# Patient Record
Sex: Male | Born: 1995 | Race: White | Hispanic: No
Health system: Southern US, Community
[De-identification: ages and names within clinical notes are randomized; demographics above are authoritative.]

---

## 2019-11-23 ENCOUNTER — Emergency Department (HOSPITAL_COMMUNITY): Payer: No Typology Code available for payment source

## 2019-11-23 ENCOUNTER — Emergency Department (HOSPITAL_COMMUNITY)
Admission: EM | Admit: 2019-11-23 | Discharge: 2019-11-23 | Discharge status: Against Medical Advice | Payer: No Typology Code available for payment source | Attending: Emergency Medicine | Admitting: Emergency Medicine

## 2019-11-23 DIAGNOSIS — F1510 Other stimulant abuse, uncomplicated: Secondary | ICD-10-CM | POA: Diagnosis not present

## 2019-11-23 DIAGNOSIS — R400 Somnolence: Secondary | ICD-10-CM | POA: Diagnosis not present

## 2019-11-23 DIAGNOSIS — Y92410 Unspecified street and highway as the place of occurrence of the external cause: Secondary | ICD-10-CM | POA: Diagnosis not present

## 2019-11-23 DIAGNOSIS — Y998 Other external cause status: Secondary | ICD-10-CM | POA: Diagnosis not present

## 2019-11-23 DIAGNOSIS — Z5320 Procedure and treatment not carried out because of patient's decision for unspecified reasons: Secondary | ICD-10-CM | POA: Insufficient documentation

## 2019-11-23 DIAGNOSIS — F1210 Cannabis abuse, uncomplicated: Secondary | ICD-10-CM | POA: Insufficient documentation

## 2019-11-23 DIAGNOSIS — R9089 Other abnormal findings on diagnostic imaging of central nervous system: Secondary | ICD-10-CM | POA: Diagnosis not present

## 2019-11-23 DIAGNOSIS — Y9389 Activity, other specified: Secondary | ICD-10-CM | POA: Diagnosis not present

## 2019-11-23 DIAGNOSIS — Z041 Encounter for examination and observation following transport accident: Secondary | ICD-10-CM | POA: Diagnosis present

## 2019-11-23 LAB — CBC WITH DIFFERENTIAL/PLATELET
Abs Immature Granulocytes: 0.03 10*3/uL (ref 0.00–0.07)
Basophils Absolute: 0.1 10*3/uL (ref 0.0–0.1)
Basophils Relative: 1 %
Eosinophils Absolute: 0.1 10*3/uL (ref 0.0–0.5)
Eosinophils Relative: 1 %
HCT: 46.6 % (ref 39.0–52.0)
Hemoglobin: 15.7 g/dL (ref 13.0–17.0)
Immature Granulocytes: 0 %
Lymphocytes Relative: 22 %
Lymphs Abs: 1.6 10*3/uL (ref 0.7–4.0)
MCH: 30 pg (ref 26.0–34.0)
MCHC: 33.7 g/dL (ref 30.0–36.0)
MCV: 89.1 fL (ref 80.0–100.0)
Monocytes Absolute: 0.6 10*3/uL (ref 0.1–1.0)
Monocytes Relative: 8 %
Neutro Abs: 4.8 10*3/uL (ref 1.7–7.7)
Neutrophils Relative %: 68 %
Platelets: 240 10*3/uL (ref 150–400)
RBC: 5.23 MIL/uL (ref 4.22–5.81)
RDW: 12 % (ref 11.5–15.5)
WBC: 7.1 10*3/uL (ref 4.0–10.5)
nRBC: 0 % (ref 0.0–0.2)

## 2019-11-23 LAB — COMPREHENSIVE METABOLIC PANEL
ALT: 26 U/L (ref 0–44)
AST: 25 U/L (ref 15–41)
Albumin: 4.2 g/dL (ref 3.5–5.0)
Alkaline Phosphatase: 75 U/L (ref 38–126)
Anion gap: 10 (ref 5–15)
BUN: 14 mg/dL (ref 6–20)
CO2: 26 mmol/L (ref 22–32)
Calcium: 9.5 mg/dL (ref 8.9–10.3)
Chloride: 103 mmol/L (ref 98–111)
Creatinine, Ser: 0.95 mg/dL (ref 0.61–1.24)
GFR calc Af Amer: >60 mL/min (ref >60)
GFR calc non Af Amer: >60 mL/min (ref >60)
Glucose, Bld: 116 mg/dL — ABNORMAL HIGH (ref 70–99)
Potassium: 4.1 mmol/L (ref 3.5–5.1)
Sodium: 139 mmol/L (ref 135–145)
Total Bilirubin: 0.7 mg/dL (ref 0.3–1.2)
Total Protein: 7.2 g/dL (ref 6.5–8.1)

## 2019-11-23 LAB — URINALYSIS, ROUTINE W REFLEX MICROSCOPIC
Bilirubin Urine: NEGATIVE
Glucose, UA: NEGATIVE mg/dL
Hgb urine dipstick: NEGATIVE
Ketones, ur: NEGATIVE mg/dL
Leukocytes,Ua: NEGATIVE
Nitrite: NEGATIVE
Protein, ur: NEGATIVE mg/dL
Specific Gravity, Urine: 1.02 (ref 1.005–1.030)
pH: 5.5 (ref 5.0–8.0)

## 2019-11-23 LAB — RAPID URINE DRUG SCREEN, HOSP PERFORMED
Amphetamines: POSITIVE — AB
Barbiturates: NOT DETECTED
Benzodiazepines: NOT DETECTED
Cocaine: NOT DETECTED
Opiates: NOT DETECTED
Tetrahydrocannabinol: POSITIVE — AB

## 2019-11-23 LAB — ETHANOL: Alcohol, Ethyl (B): <10 mg/dL (ref <10)

## 2019-11-23 MED ORDER — SODIUM CHLORIDE 0.9 % IV BOLUS
1000.0000 mL | Freq: Once | INTRAVENOUS | Status: AC
  Administered 2019-11-23: 1000 mL via INTRAVENOUS
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
## 2019-11-23 NOTE — ED Notes (Signed)
Patient transported to CT 

## 2019-11-23 NOTE — ED Notes (Signed)
Pt expressed desire to leave AMA, PA notified. Pt refused vitals. Pt verbalized understanding of dangers of leaving AMA.  Also verbalized understanding of S/S requiring return to ED.

## 2019-11-24 ENCOUNTER — Encounter (HOSPITAL_COMMUNITY): Payer: Self-pay | Admitting: Emergency Medicine
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
